# Patient Record
Sex: Female | Born: 1995 | Race: White | Hispanic: No | Marital: Single | Smoking: Never smoker
Health system: Southern US, Community
[De-identification: ages and names within clinical notes are randomized; demographics above are authoritative.]

## PROBLEM LIST (undated history)

## (undated) DIAGNOSIS — I059 Rheumatic mitral valve disease, unspecified: Secondary | ICD-10-CM

## (undated) DIAGNOSIS — G901 Familial dysautonomia [Riley-Day]: Secondary | ICD-10-CM

## (undated) DIAGNOSIS — R42 Dizziness and giddiness: Secondary | ICD-10-CM

## (undated) DIAGNOSIS — J45909 Unspecified asthma, uncomplicated: Secondary | ICD-10-CM

## (undated) HISTORY — DX: Dizziness and giddiness: R42

## (undated) HISTORY — DX: Unspecified asthma, uncomplicated: J45.909

## (undated) HISTORY — DX: Familial dysautonomia (riley-day): G90.1

## (undated) HISTORY — PX: TONSILLECTOMY AND ADENOIDECTOMY: SHX28

---
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
## 2017-06-13 ENCOUNTER — Encounter (INDEPENDENT_AMBULATORY_CARE_PROVIDER_SITE_OTHER): Payer: Self-pay

## 2017-06-13 ENCOUNTER — Ambulatory Visit (INDEPENDENT_AMBULATORY_CARE_PROVIDER_SITE_OTHER): Payer: BLUE CROSS/BLUE SHIELD | Admitting: Internal Medicine

## 2017-06-13 ENCOUNTER — Encounter: Payer: Self-pay | Admitting: Internal Medicine

## 2017-06-13 DIAGNOSIS — R002 Palpitations: Secondary | ICD-10-CM | POA: Diagnosis not present

## 2017-06-13 DIAGNOSIS — R42 Dizziness and giddiness: Secondary | ICD-10-CM | POA: Diagnosis not present

## 2017-06-13 DIAGNOSIS — I10 Essential (primary) hypertension: Secondary | ICD-10-CM | POA: Diagnosis not present

## 2017-06-13 MED ORDER — PINDOLOL 5 MG PO TABS: 5.0000 mg | ORAL_TABLET | Freq: Two times a day (BID) | ORAL | 3 refills | Status: DC

## 2017-06-13 MED ORDER — PINDOLOL 5 MG PO TABS: 2.5000 mg | ORAL_TABLET | Freq: Two times a day (BID) | ORAL | 3 refills | Status: DC

## 2017-06-13 NOTE — Patient Instructions (Addendum)
Your physician has recommended you make the following change in your medication:  1.) start pindolol 2.5 mg twice a day  Your physician recommends that you schedule a follow-up appointment in: 4-6 weeks with Dr. Tenny Crawoss.  We will plan on Wednesday July 11.

## 2017-06-13 NOTE — Progress Notes (Signed)
Cardiology Office Note   Date:  06/13/2017   ID:  Alexandra Malone, DOB [birth year], MRN [id number]  PCP:  System, Provider Not In  Cardiologist:   Dietrich PatesPaula Pedro [place]enburg, MD   Patient presents for evaluation of chest tightness    History of Present Illness: Alexandra Malone is a 21 y.o. female with a history of chest tightness  Started at age 21.  Thought she had astma   Treated with inhalers which did not help  High school had  Syncope  Two years ago had syncope while having bowel movement    Recently BP has been up  More dizziness and he has had chest pain   153/100 (sore throat)  on 5/25   126/91 (broken foot) on 4/12    Works and goes to school  [employer]    Lifts weights  Trys to stay away from aerobic becase HR buzzer goes off    Appetite  Bowels moving OK  Drinks water 3 L per day  Craves salt    Current Meds  Medication Sig  . TRI-LO-MARZIA 0.18/0.215/0.25 MG-25 MCG tab Take 1 tablet by mouth daily.      Allergies:   Latex; Shellfish allergy; and Shellfish-derived products   Past Medical History:  Diagnosis Date  . Asthma   . Dizziness   . Mitral valve disorder     History reviewed. No pertinent surgical history.   Social History:  The patient  reports that she has never smoked. She has never used smokeless tobacco. She reports that she does not drink alcohol or use drugs.   Family History:  The patient's family history is not on file.    ROS:  Please see the history of present illness. All other systems are reviewed and  Negative to the above problem except as noted.    PHYSICAL EXAM: VS:  BP 128/72   Ht 5' 3.5" (1.613 m)   Wt 54.9 kg (121 lb 1.9 oz)   BMI 21.12 kg/m   GEN: Well nourished, well developed, in no acute distress  HEENT: normal  Neck: no JVD, carotid bruits, or masses Cardiac: RRR; no murmurs, rubs, or gallops,no edema  Respiratory:  clear to auscultation bilaterally, normal work of breathing GI: soft, nontender, nondistended, + BS  No  hepatomegaly  MS: no deformity Moving all extremities   Skin: warm and dry, no rash Neuro:  Strength and sensation are intact Psych: euthymic mood, full affect   EKG:  EKG is ordered today.SR 77 bpm     Lipid Panel No results found for: CHOL, TRIG, HDL, CHOLHDL, VLDL, LDLCALC, LDLDIRECT    Wt Readings from Last 3 Encounters:  06/13/17 54.9 kg (121 lb 1.9 oz) (36 %, Z= -0.35)*  10/01/14 49.4 kg (109 lb) (22 %, Z= -0.77)*   * Growth percentiles are based on CDC 2-20 Years data.      ASSESSMENT AND PLAN:  Dizziness, hypertension  Pt's history is suspicious for autonomic dysfuntion even though she is not orthostatic     I would recomm a trial of low dose pintolol 2.5 bid   Follow up later this summer  Drink adequate lipids  Try to stay active aerobically  2  HTN  Pindolol as noted     Current medicines are reviewed at length with the patient today.  The patient does not have concerns regarding medicines.  Signed, Dietrich PatesPaula Frady Taddeo, MD  06/13/2017 3:04 PM    [facility] [address], [place], [place]  [address] Phone: [phone]; Fax: [phone]

## 2017-07-21 ENCOUNTER — Telehealth: Payer: Self-pay | Admitting: Internal Medicine

## 2017-07-21 NOTE — Telephone Encounter (Signed)
Pt called as she is having difficulty sleeping, having night terror, about 5 nights a week since starting this medication. Pt stated she has started taking some Melatonin at night that she thinks is helping but she is still having the palpitations and may need an increased dose of Pindolol.  Pt states she was told our office would call her to schedule f/u as many dates in July and August were discussed but she never received a call to finalize. Pt returns to college on 8/16, so scheduled to see Dr. Tenny Crawoss at [place] office on 8/3 at 8:20am. Pt states she is unsure if she has any refill left on medication as she takes last dose today. Educated pt that 3 refills were sent to her [facility] and to call our office if she has any issue getting refills. RN told pt message will be sent to Dr. Tenny Crawoss to make her aware. Pt verbalized understanding no additional questions at this time.

## 2017-07-21 NOTE — Telephone Encounter (Signed)
New Message     Pt c/o medication issue:  1. Name of Medication:   pindolol (VISKEN) 5 MG tablet Take 0.5 tablets (2.5 mg total) by mouth 2 (two) times daily.     2. How are you currently taking this medication (dosage and times per day)?  2 x a day   3. Are you having a reaction (difficulty breathing--STAT)? Not sleeping   4. What is your medication issue?  Not helping with bp and palpitations , she thinks she needs a higher dose

## 2017-07-25 ENCOUNTER — Ambulatory Visit (INDEPENDENT_AMBULATORY_CARE_PROVIDER_SITE_OTHER): Payer: BLUE CROSS/BLUE SHIELD | Admitting: Internal Medicine

## 2017-07-25 ENCOUNTER — Encounter: Payer: Self-pay | Admitting: Internal Medicine

## 2017-07-25 VITALS — BP 112/78 | HR 100 | Ht 63.0 in | Wt 124.0 lb

## 2017-07-25 DIAGNOSIS — R42 Dizziness and giddiness: Secondary | ICD-10-CM

## 2017-07-25 DIAGNOSIS — R002 Palpitations: Secondary | ICD-10-CM

## 2017-07-25 DIAGNOSIS — I10 Essential (primary) hypertension: Secondary | ICD-10-CM | POA: Diagnosis not present

## 2017-07-25 NOTE — Progress Notes (Signed)
Cardiology Office Note   Date:  07/26/2017   ID:  Alexandra RammingFallon Huckeby, DOB [birth year], MRN [id number]  PCP:  System, Provider Not In  Cardiologist:   Dietrich PatesPaula Oakley Orban, MD   Patient  presents for f/u of dizziness and  chest tightness    History of Present Illness: Patient is a 21 yo who presents for f/u   Pt with history of syncope (with bowel movement), dizziness and chst tightness   Hx of HTN   I saw her earlier this spring  REcomm she try pindolol  The pt says that on pindolol she had some very vivid dreams  May be improving  Stays active  Some dizziness  SOme HA (posteriror; taking magnesium) Denies syncope   Starts back at [employer] in a few wks  She says she feels better there on campus  [employer]  Drinking ample fluids      Current Meds  Medication Sig  . pindolol (VISKEN) 5 MG tablet Take 0.5 tablets (2.5 mg total) by mouth 2 (two) times daily.  . TRI-LO-MARZIA 0.18/0.215/0.25 MG-25 MCG tab Take 1 tablet by mouth daily.      Allergies:   Latex; Shellfish allergy; and Shellfish-derived products   Past Medical History:  Diagnosis Date  . Asthma   . Dizziness   . Mitral valve disorder     Past Surgical History:  Procedure Laterality Date  . ABDOMINAL WALL DEFECT REPAIR  1998  . TONSILLECTOMY AND ADENOIDECTOMY       Social History:  The patient  reports that she has never smoked. She has never used smokeless tobacco. She reports that she does not drink alcohol or use drugs.   Family History:  The patient's family history includes Anxiety disorder in her father; Hypertension in her paternal grandmother.    ROS:  Please see the history of present illness. All other systems are reviewed and  Negative to the above problem except as noted.    PHYSICAL EXAM: VS:  BP 112/78   Pulse 100   Ht 5\' 3"  (1.6 m)   Wt 124 lb (56.2 kg)   SpO2 97%   BMI 21.97 kg/m   GEN: Well nourished, well developed, in no acute distress  HEENT: normal  Neck: no JVD, carotid bruits, or  masses Cardiac: RRR; no murmurs, rubs, or gallops,no edema  Respiratory:  clear to auscultation bilaterally, normal work of breathing GI: soft, nontender, nondistended, + BS  No hepatomegaly  MS: no deformity Moving all extremities   Skin: warm and dry, no rash Neuro:  Strength and sensation are intact Psych: euthymic mood, full affect   EKG:  EKG is not ordered     Lipid Panel No results found for: CHOL, TRIG, HDL, CHOLHDL, VLDL, LDLCALC, LDLDIRECT    Wt Readings from Last 3 Encounters:  07/25/17 124 lb (56.2 kg) (42 %, Z= -0.21)*  06/13/17 121 lb 1.9 oz (54.9 kg) (36 %, Z= -0.35)*  10/01/14 109 lb (49.4 kg) (22 %, Z= -0.77)*   * Growth percentiles are based on CDC 2-20 Years data.      ASSESSMENT AND PLAN:  1  Dizziness  Overall I get impression that not as bad  HR does go up with standing   Stay hydrated  Stay active  Keep on same regimen  2  HTN  BP is better   I would deep on same regimen  Follow dreams  If worsens will consider antother option    Pt will call when  back from break   Signed, Dietrich PatesPaula Braelon Sprung, MD  07/26/2017 11:56 PM    [facility] [address], [place], [place]  [address] Phone: [phone]; Fax: [phone]

## 2017-07-25 NOTE — Patient Instructions (Addendum)
Your physician recommends that you schedule a follow-up appointment in: keep in contact via e-mail, plan to see Dr.Ross at winter break.    Your physician recommends that you continue on your current medications as directed. Please refer to the Current Medication list given to you today.      No testing or lab work ordered today.       Thank you for choosing [facility] !

## 2018-01-01 ENCOUNTER — Other Ambulatory Visit: Payer: Self-pay | Admitting: *Deleted

## 2018-01-02 MED ORDER — PINDOLOL 5 MG PO TABS: 2.5000 mg | ORAL_TABLET | Freq: Two times a day (BID) | ORAL | 3 refills | Status: DC

## 2018-06-15 ENCOUNTER — Other Ambulatory Visit: Payer: Self-pay | Admitting: Internal Medicine

## 2018-12-12 ENCOUNTER — Other Ambulatory Visit: Payer: Self-pay | Admitting: Internal Medicine

## 2019-07-27 ENCOUNTER — Telehealth: Payer: Self-pay

## 2019-07-27 NOTE — Telephone Encounter (Signed)
YOUR CARDIOLOGY TEAM HAS ARRANGED FOR AN E-VISIT FOR YOUR APPOINTMENT - PLEASE REVIEW IMPORTANT INFORMATION BELOW SEVERAL DAYS PRIOR TO YOUR APPOINTMENT  Due to the recent COVID-19 pandemic, we are transitioning in-person office visits to tele-medicine visits in an effort to decrease unnecessary exposure to our patients, their families, and staff. These visits are billed to your insurance just like a normal visit is. We also encourage you to sign up for MyChart if you have not already done so. You will need a smartphone if possible. For patients that do not have this, we can still complete the visit using a regular telephone but do prefer a smartphone to enable video when possible. You may have a family member that lives with you that can help. If possible, we also ask that you have a blood pressure cuff and scale at home to measure your blood pressure, heart rate and weight prior to your scheduled appointment. Patients with clinical needs that need an in-person evaluation and testing will still be able to come to the office if absolutely necessary. If you have any questions, feel free to call our office.     YOUR PROVIDER WILL BE USING THE FOLLOWING PLATFORM TO COMPLETE YOUR VISIT: [email or web address]  . IF USING MYCHART - How to Download the MyChart App to Your SmartPhone   - If Apple, go to App Store and type in MyChart in the search bar and download the app. If Android, ask patient to go to Google Play Store and type in MyChart in the search bar and download the app. The app is free but as with any other app downloads, your phone may require you to verify saved payment information or Apple/Android password.  - You will need to then log into the app with your MyChart username and password, and select [facility] as your healthcare provider to link the account.  - When it is time for your visit, go to the MyChart app, find appointments, and click Begin Video Visit. Be sure to Select Allow for your device to  access the Microphone and Camera for your visit. You will then be connected, and your provider will be with you shortly.  **If you have any issues connecting or need assistance, please contact MyChart service desk [phone] ([phone])**  **If using a computer, in order to ensure the best quality for your visit, you will need to use either of the following Internet Browsers: Google Chrome or Microsoft Edge**  . IF USING DOXIMITY or [email or web address].ME - The staff will give you instructions on receiving your link to join the meeting the day of your visit.      2-3 DAYS BEFORE YOUR APPOINTMENT  You will receive a telephone call from one of our [facility] team members - your caller ID may say "Unknown caller." If this is a video visit, we will walk you through how to get the video launched on your phone. We will remind you check your blood pressure, heart rate and weight prior to your scheduled appointment. If you have an Apple Watch or Kardia, please upload any pertinent ECG strips the day before or morning of your appointment to MyChart. Our staff will also make sure you have reviewed the consent and agree to move forward with your scheduled tele-health visit.     THE DAY OF YOUR APPOINTMENT  Approximately 15 minutes prior to your scheduled appointment, you will receive a telephone call from one of [facility] team - your caller ID may say "Unknown caller."    Our staff will confirm medications, vital signs for the day and any symptoms you may be experiencing. Please have this information available prior to the time of visit start. It may also be helpful for you to have a pad of paper and pen handy for any instructions given during your visit. They will also walk you through joining the smartphone meeting if this is a video visit.    CONSENT FOR TELE-HEALTH VISIT - PLEASE REVIEW  I hereby voluntarily request, consent and authorize [facility] and its employed or contracted physicians, physician  assistants, nurse practitioners or other licensed health care professionals (the Practitioner), to provide me with telemedicine health care services (the "Services") as deemed necessary by the treating Practitioner. I acknowledge and consent to receive the Services by the Practitioner via telemedicine. I understand that the telemedicine visit will involve communicating with the Practitioner through live audiovisual communication technology and the disclosure of certain medical information by electronic transmission. I acknowledge that I have been given the opportunity to request an in-person assessment or other available alternative prior to the telemedicine visit and am voluntarily participating in the telemedicine visit.  I understand that I have the right to withhold or withdraw my consent to the use of telemedicine in the course of my care at any time, without affecting my right to future care or treatment, and that the Practitioner or I may terminate the telemedicine visit at any time. I understand that I have the right to inspect all information obtained and/or recorded in the course of the telemedicine visit and may receive copies of available information for a reasonable fee.  I understand that some of the potential risks of receiving the Services via telemedicine include:  . Delay or interruption in medical evaluation due to technological equipment failure or disruption; . Information transmitted may not be sufficient (e.g. poor resolution of images) to allow for appropriate medical decision making by the Practitioner; and/or  . In rare instances, security protocols could fail, causing a breach of personal health information.  Furthermore, I acknowledge that it is my responsibility to provide information about my medical history, conditions and care that is complete and accurate to the best of my ability. I acknowledge that Practitioner's advice, recommendations, and/or decision may be based on  factors not within their control, such as incomplete or inaccurate data provided by me or distortions of diagnostic images or specimens that may result from electronic transmissions. I understand that the practice of medicine is not an exact science and that Practitioner makes no warranties or guarantees regarding treatment outcomes. I acknowledge that I will receive a copy of this consent concurrently upon execution via email to the email address I last provided but may also request a printed copy by calling the office of [facility].    I understand that my insurance will be billed for this visit.   I have read or had this consent read to me. . I understand the contents of this consent, which adequately explains the benefits and risks of the Services being provided via telemedicine.  . I have been provided ample opportunity to ask questions regarding this consent and the Services and have had my questions answered to my satisfaction. . I give my informed consent for the services to be provided through the use of telemedicine in my medical care  By participating in this telemedicine visit I agree to the above.  

## 2019-07-29 ENCOUNTER — Telehealth: Payer: BLUE CROSS/BLUE SHIELD | Admitting: Physician Assistant

## 2019-07-29 NOTE — Progress Notes (Signed)
Virtual Visit via Video Note   This visit type was conducted due to national recommendations for restrictions regarding the COVID-19 Pandemic (e.g. social distancing) in an effort to limit this patient's exposure and mitigate transmission in our community.  Due to her co-morbid illnesses, this patient is at least at moderate risk for complications without adequate follow up.  This format is felt to be most appropriate for this patient at this time.  All issues noted in this document were discussed and addressed.  A limited physical exam was performed with this format.  Please refer to the patient's chart for her consent to telehealth for [facility].   Date:  07/30/2019   ID:  Alexandra Malone, DOB Apr 11, 1996, MRN [id number]  Patient Location: Home Provider Location: Home  PCP:  System, Provider Not In  Cardiologist:  Dietrich PatesPaula Ross, MD  Electrophysiologist:  None   Evaluation Performed:  Follow-Up Visit  Chief Complaint:  FU on HTN, autonomic dysfunction  History of Present Illness:    Alexandra RammingFallon Malone is a 23 y.o. female with:  Hypertension  Autonomic dysfunction  Ms. Alexandra Malone was last seen by Dr. Tenny Crawoss in 07/2017.  Today, she is seen for follow-up.  She has been doing well.  She increased her pindolol to 5 mg twice daily.  Her blood pressures have been optimal.  She has not had a blood pressure cuff for a few months now.  However, her pressure was ranging in the 120s systolic.  She has not had chest pain, shortness of breath, syncope.  She has not had any significant dizziness.  She returns to complete her senior year at [employer] in the next week. Unfortunately, her classes will all be virtual.  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  The patient does not have symptoms concerning for COVID-19 infection (fever, chills, cough, or new shortness of breath).    Past Medical History:  Diagnosis Date  . Asthma   . Dizziness   . Mitral valve  disorder    Past Surgical History:  Procedure Laterality Date  . ABDOMINAL WALL DEFECT REPAIR  1998  . TONSILLECTOMY AND ADENOIDECTOMY       Current Meds  Medication Sig  . pindolol (VISKEN) 5 MG tablet Take 1 tablet (5 mg total) by mouth 2 (two) times daily. Please make overdue appt with Dr. Tenny Crawoss before anymore refills. 1st attempt  . sertraline (ZOLOFT) 25 MG tablet Take 25 mg by mouth daily.  . TRI-LO-MARZIA 0.18/0.215/0.25 MG-25 MCG tab Take 1 tablet by mouth daily.   . [DISCONTINUED] pindolol (VISKEN) 5 MG tablet Take 0.5 tablets (2.5 mg total) by mouth 2 (two) times daily. Please make overdue appt with Dr. Tenny Crawoss before anymore refills. 1st attempt     Allergies:   Latex, Shellfish allergy, and Shellfish-derived products   Social History   Tobacco Use  . Smoking status: Never Smoker  . Smokeless tobacco: Never Used  Substance Use Topics  . Alcohol use: No  . Drug use: No     Family Hx: The patient's family history includes Anxiety disorder in her father; Hypertension in her paternal grandmother.  ROS:   Please see the history of present illness.     All other systems reviewed and are negative.   Prior CV studies:   The following studies were reviewed today:  None  Labs/Other Tests and Data Reviewed:    EKG:  No ECG reviewed.  Recent Labs: No results found for requested labs  within last 8760 hours.   Recent Lipid Panel No results found for: CHOL, TRIG, HDL, CHOLHDL, LDLCALC, LDLDIRECT  Wt Readings from Last 3 Encounters:  07/30/19 125 lb (56.7 kg)  07/25/17 124 lb (56.2 kg) (42 %, Z= -0.21)*  06/13/17 121 lb 1.9 oz (54.9 kg) (36 %, Z= -0.35)*   * Growth percentiles are based on CDC (Girls, 2-20 Years) data.     Objective:    Vital Signs:  Ht 5\' 3"  (1.6 m)   Wt 125 lb (56.7 kg)   BMI 22.14 kg/m    VITAL SIGNS:  reviewed GEN:  no acute distress EYES:  EOMI RESPIRATORY:  Normal respiratory effort NEURO:  Alert and oriented PSYCH:  normal affect   ASSESSMENT & PLAN:    1. Essential hypertension The patient's blood pressure is controlled on her current regimen.  Continue current therapy.   2. Autonomic dysfunction Symptoms overall quiescent on current dose of beta-blocker.  Continue current therapy.   COVID-19 Education: The signs and symptoms of COVID-19 were discussed with the patient and how to seek care for testing (follow up with PCP or arrange E-visit).  The importance of social distancing was discussed today.  Time:   Today, I have spent 5 minutes with the patient with telehealth technology discussing the above problems.     Medication Adjustments/Labs and Tests Ordered: Current medicines are reviewed at length with the patient today.  Concerns regarding medicines are outlined above.   Tests Ordered: No orders of the defined types were placed in this encounter.   Medication Changes: Meds ordered this encounter  Medications  . pindolol (VISKEN) 5 MG tablet    Sig: Take 1 tablet (5 mg total) by mouth 2 (two) times daily. Please make overdue appt with Dr. Harrington Challenger before anymore refills. 1st attempt    Dispense:  180 tablet    Refill:  3    Please call our office to schedule an overdue appointment with Dr. Harrington Challenger before anymore refills. [phone]. Thank you 1st attempt    Follow Up:  Virtual Visit or In Person in 1 year(s)  Signed, Richardson Dopp, PA-C  07/30/2019 1:04 PM    [place]

## 2019-07-30 ENCOUNTER — Telehealth (INDEPENDENT_AMBULATORY_CARE_PROVIDER_SITE_OTHER): Payer: BLUE CROSS/BLUE SHIELD | Admitting: Physician Assistant

## 2019-07-30 ENCOUNTER — Other Ambulatory Visit: Payer: Self-pay

## 2019-07-30 VITALS — Ht 63.0 in | Wt 125.0 lb

## 2019-07-30 DIAGNOSIS — G909 Disorder of the autonomic nervous system, unspecified: Secondary | ICD-10-CM

## 2019-07-30 DIAGNOSIS — I10 Essential (primary) hypertension: Secondary | ICD-10-CM

## 2019-07-30 MED ORDER — PINDOLOL 5 MG PO TABS: 5.0000 mg | ORAL_TABLET | Freq: Two times a day (BID) | ORAL | 3 refills | Status: DC

## 2019-07-30 NOTE — Patient Instructions (Signed)
Medication Instructions:   Your physician recommends that you continue on your current medications as directed. Please refer to the Current Medication list given to you today. [facility] with 3 refills.  If you need a refill on your cardiac medications before your next appointment, please call your pharmacy.   Lab work:  None ordered today  If you have labs (blood work) drawn today and your tests are completely normal, you will receive your results only by: Marland Kitchen MyChart Message (if you have MyChart) OR . A paper copy in the mail If you have any lab test that is abnormal or we need to change your treatment, we will call you to review the results.  Testing/Procedures:  None ordered today  Follow-Up: At [facility], you and your health needs are our priority.  As part of our continuing mission to provide you with exceptional heart care, we have created designated Provider Care Teams.  These Care Teams include your primary Cardiologist (physician) and Advanced Practice Providers (APPs -  Physician Assistants and Nurse Practitioners) who all work together to provide you with the care you need, when you need it. You will need a follow up appointment in 12 months.  Please call our office 2 months in advance to schedule this appointment.  You may see Dorris Carnes, MD or one of the following Advanced Practice Providers on your designated Care Team:    [place], PA-C Melina Copa, PA-C . Ermalinda Barrios, PA-C

## 2019-08-11 ENCOUNTER — Telehealth: Payer: Self-pay | Admitting: Physician Assistant

## 2019-08-11 NOTE — Telephone Encounter (Signed)
Attempted to call the pt twice and phone picked up but could hear anyone on the other end will try back this afternoon.

## 2019-08-11 NOTE — Telephone Encounter (Signed)
LMTCB

## 2019-08-11 NOTE — Telephone Encounter (Signed)
New Message:   Pt says she have been exposed to [place]. She says in a few days she will have to be tested for it. Her question is, if her results comes back positive. Will her being quarantined be enough with her condition please?

## 2019-08-12 NOTE — Telephone Encounter (Signed)
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 She denies any symptoms and reports feeling normal. I advised pt if she is symptomatic she could give [place] a call back but in the meantime continue self quarantining.

## 2020-11-12 NOTE — Progress Notes (Signed)
Virtual Visit via Video Note   This visit type was conducted due to national recommendations for restrictions regarding the COVID-19 Pandemic (e.g. social distancing) in an effort to limit this patient's exposure and mitigate transmission in our community.  Due to her co-morbid illnesses, this patient is at least at moderate risk for complications without adequate follow up.  This format is felt to be most appropriate for this patient at this time.  All issues noted in this document were discussed and addressed.  A limited physical exam was performed with this format.  Please refer to the patient's chart for her consent to telehealth for [facility].    Date:  11/13/2020   ID:  Alexandra Malone, DOB Jul 29, 1996, MRN [id number] The patient was identified using 2 identifiers.  Patient Location: Home Provider Location: Home Office  PCP:  System, Provider Not In  Cardiologist:  Dietrich Pates, MD   Electrophysiologist:  None   Evaluation Performed:  Follow-Up Visit  Chief Complaint:  Follow-up (HTN, autonomic dysfunction)    Patient Profile: Alexandra Malone is a 24 y.o. female with:  Hypertension   Autonomic dysfunction     History of Present Illness:   Alexandra Malone was last seen via Telemedicine in 07/2019.  She is seen for f/u.  She graduated from [employer] in the spring.  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  She is currently finishing up some of her pre-requisites.  She has been doing well overall.  Her BPs have been optimal.  She only occasionally has orthostatic symptoms.  She has not had chest pain, shortness of breath, syncope.  She feels the pindolol continues to control her symptoms well.    Past Medical History:  Diagnosis Date  . Asthma   . Dizziness   . Mitral valve disorder    Past Surgical History:  Procedure Laterality Date  . ABDOMINAL WALL DEFECT REPAIR  1998  . TONSILLECTOMY AND ADENOIDECTOMY       Current Meds  Medication Sig    . pindolol (VISKEN) 5 MG tablet Take 1 tablet (5 mg total) by mouth 2 (two) times daily.  . sertraline (ZOLOFT) 25 MG tablet Take 25 mg by mouth daily.  . TRI-LO-MARZIA 0.18/0.215/0.25 MG-25 MCG tab Take 1 tablet by mouth daily.   . [DISCONTINUED] pindolol (VISKEN) 5 MG tablet Take 1 tablet (5 mg total) by mouth 2 (two) times daily. Please make overdue appt with Dr. Tenny Craw before anymore refills. 1st attempt     Allergies:   Latex, Shellfish allergy, and Shellfish-derived products   Social History   Tobacco Use  . Smoking status: Never Smoker  . Smokeless tobacco: Never Used  Vaping Use  . Vaping Use: Never used  Substance Use Topics  . Alcohol use: No  . Drug use: No     Family Hx: The patient's family history includes Anxiety disorder in her father; Hypertension in her paternal grandmother.  ROS:   Please see the history of present illness.      Labs/Other Tests and Data Reviewed:    EKG:  No ECG reviewed.  Recent Labs: No results found for requested labs within last 8760 hours.   Recent Lipid Panel No results found for: CHOL, TRIG, HDL, CHOLHDL, LDLCALC, LDLDIRECT  Wt Readings from Last 3 Encounters:  11/13/20 115 lb (52.2 kg)  07/30/19 125 lb (56.7 kg)  07/25/17 124 lb (56.2 kg) (42 %, Z= -0.21)*   * Growth percentiles are based on  CDC (Girls, 2-20 Years) data.     Risk Assessment/Calculations:      Objective:    Vital Signs:  BP 120/75   Ht 5\' 3"  (1.6 m)   Wt 115 lb (52.2 kg)   BMI 20.37 kg/m    VITAL SIGNS:  reviewed GEN:  no acute distress EYES:  sclerae anicteric, EOMI - Extraocular Movements Intact RESPIRATORY:  normal respiratory effort PSYCH:  normal affect  ASSESSMENT & PLAN:    1. Essential hypertension The patient's blood pressure is controlled on her current regimen.  Continue current therapy.    2. Autonomic dysfunction Overall, her symptoms are controlled on the current dose of Pindolol.  Continue current Rx.        Time:    Today, I have spent 3 minutes with the patient with telehealth technology discussing the above problems.     Medication Adjustments/Labs and Tests Ordered: Current medicines are reviewed at length with the patient today.  Concerns regarding medicines are outlined above.   Tests Ordered: No orders of the defined types were placed in this encounter.   Medication Changes: Meds ordered this encounter  Medications  . pindolol (VISKEN) 5 MG tablet    Sig: Take 1 tablet (5 mg total) by mouth 2 (two) times daily.    Dispense:  180 tablet    Refill:  3    Follow Up:  In Person in 1 year(s)  Signed, , PA-C  11/13/2020 3:01 PM    [facility]

## 2020-11-13 ENCOUNTER — Telehealth (INDEPENDENT_AMBULATORY_CARE_PROVIDER_SITE_OTHER): Payer: Self-pay | Admitting: Physician Assistant

## 2020-11-13 ENCOUNTER — Encounter: Payer: Self-pay | Admitting: Physician Assistant

## 2020-11-13 ENCOUNTER — Other Ambulatory Visit: Payer: Self-pay

## 2020-11-13 VITALS — BP 120/75 | Ht 63.0 in | Wt 115.0 lb

## 2020-11-13 DIAGNOSIS — I10 Essential (primary) hypertension: Secondary | ICD-10-CM

## 2020-11-13 DIAGNOSIS — G909 Disorder of the autonomic nervous system, unspecified: Secondary | ICD-10-CM

## 2020-11-13 MED ORDER — PINDOLOL 5 MG PO TABS: 5.0000 mg | ORAL_TABLET | Freq: Two times a day (BID) | ORAL | 3 refills | Status: AC

## 2020-11-13 NOTE — Patient Instructions (Signed)
Medication Instructions:  Your physician recommends that you continue on your current medications as directed. Please refer to the Current Medication list given to you today.  *If you need a refill on your cardiac medications before your next appointment, please call your pharmacy*  Lab Work: None ordered today  Testing/Procedures: None ordered today  Follow-Up: At [facility], you and your health needs are our priority.  As part of our continuing mission to provide you with exceptional heart care, we have created designated Provider Care Teams.  These Care Teams include your primary Cardiologist (physician) and Advanced Practice Providers (APPs -  Physician Assistants and Nurse Practitioners) who all work together to provide you with the care you need, when you need it.  Your next appointment:   12 month(s)  The format for your next appointment:   In Person  Provider:   Dietrich Pates, MD or Tereso Newcomer, PA-C

## 2021-03-26 ENCOUNTER — Telehealth: Payer: Self-pay | Admitting: Cardiology

## 2021-03-26 NOTE — Telephone Encounter (Signed)
Pt c/o medication issue:  1. Name of Medication:  pindolol (VISKEN) 5 MG tablet [[id number]]    2. How are you currently taking this medication (dosage and times per day)?Take 1 tablet (5 mg total) by mouth 2 (two) times daily.   3. Are you having a reaction (difficulty breathing--STAT)? Na   4. What is your medication issue? Pt stated she doesn't have health ins and this med $220 .  She stated she can not afford this med.  She would like to know what her options are for a cheaper med   Best number [phone]

## 2021-03-26 NOTE — Telephone Encounter (Signed)
Returned the call to the patient. She stated that she no longer has insurance so cannot afford the Pindolol. She would like to know what else she can take that won't cost as much.  She stated that a message can be sent to her through MyChart.

## 2021-03-27 NOTE — Telephone Encounter (Signed)
It looks like [facility] be ~ [facility] [place] if she uses the Good Rx card.   See if she wants to try that first before we try to change to another med. Tereso Newcomer, PA-C    03/27/2021 1:31 PM

## 2022-03-26 ENCOUNTER — Encounter: Payer: Self-pay | Admitting: Family Medicine

## 2022-03-26 ENCOUNTER — Ambulatory Visit (INDEPENDENT_AMBULATORY_CARE_PROVIDER_SITE_OTHER): Payer: Self-pay | Admitting: Family Medicine

## 2022-03-26 VITALS — BP 114/71 | HR 72 | Temp 98.1°F | Resp 20 | Ht 63.0 in | Wt 120.0 lb

## 2022-03-26 DIAGNOSIS — G909 Disorder of the autonomic nervous system, unspecified: Secondary | ICD-10-CM | POA: Insufficient documentation

## 2022-03-26 DIAGNOSIS — I341 Nonrheumatic mitral (valve) prolapse: Secondary | ICD-10-CM | POA: Insufficient documentation

## 2022-03-26 DIAGNOSIS — Z7689 Persons encountering health services in other specified circumstances: Secondary | ICD-10-CM

## 2022-03-26 NOTE — Progress Notes (Signed)
?  ? ?Subjective:  ?Patient ID: Alexandra Malone, female    DOB: 16-Dec-1996, 26 y.o.   MRN: [id number] ? ?Patient Care Team: ?Baruch Gouty, FNP as PCP - General (Family Medicine) ?Fay Records, MD as PCP - Cardiology (Cardiology)  ? ?Chief Complaint:  Establish Care ? ? ?HPI: ?Alexandra Malone is a 26 y.o. female presenting on 03/26/2022 for Establish Care ? ? ?Pt presents today to establish care with new PCP. She has been followed by [facility] at school and those records have been requested. She has autonomic dysfunction and MVP - followed by cardiology and symptoms are well controlled with Pindolol. She states she is doing well overall. She currently is self pay and does not wish to have labs today. She denies any specific complaints or concerns. States labs and PAP with health center have been normal. 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  ? ? ?  03/26/2022  ?  9:22 AM  ?GAD 7 : Generalized Anxiety Score  ?Nervous, Anxious, on Edge 2  ?Control/stop worrying 0  ?Worry too much - different things 1  ?Trouble relaxing 1  ?Restless 0  ?Easily annoyed or irritable 1  ?Afraid - awful might happen 1  ?Total GAD 7 Score 6  ?Anxiety Difficulty Somewhat difficult  ? ? ? ?  03/26/2022  ?  9:22 AM  ?Depression screen PHQ 2/9  ?Decreased Interest 0  ?Down, Depressed, Hopeless 0  ?PHQ - 2 Score 0  ?Altered sleeping 0  ?Tired, decreased energy 0  ?Change in appetite 0  ?Feeling bad or failure about yourself  0  ?Trouble concentrating 0  ?Moving slowly or fidgety/restless 0  ?Suicidal thoughts 0  ?PHQ-9 Score 0  ?Difficult doing work/chores Not difficult at all  ? ? ? ? ?Relevant past medical, surgical, family, and social history reviewed and updated as indicated.  ?Allergies and medications reviewed and updated. Data reviewed: Chart in Epic. ? ? ?Past Medical History:  ?Diagnosis Date  ? Asthma   ? Dizziness   ? Dysautonomia ([place])   ? Mitral valve disorder   ? ? ?Past Surgical History:   ?Procedure Laterality Date  ? ABDOMINAL WALL DEFECT REPAIR  1998  ? TONSILLECTOMY AND ADENOIDECTOMY    ? ? ?Social History  ? ?Socioeconomic History  ? Marital status: Single  ?  Spouse name: Not on file  ? Number of children: Not on file  ? Years of education: Not on file  ? Highest education level: Not on file  ?Occupational History  ? Not on file  ?Tobacco Use  ? Smoking status: Never  ? Smokeless tobacco: Never  ?Vaping Use  ? Vaping Use: Never used  ?Substance and Sexual Activity  ? Alcohol use: No  ? Drug use: No  ? Sexual activity: Yes  ?  Birth control/protection: Injection  ?Other Topics Concern  ? Not on file  ?Social History Narrative  ? Not on file  ? ?Social Determinants of Health  ? ?Financial Resource Strain: Not on file  ?Food Insecurity: Not on file  ?Transportation Needs: Not on file  ?Physical Activity: Not on file  ?Stress: Not on file  ?Social Connections: Not on file  ?Intimate Partner Violence: Not on file  ? ? ?Outpatient Encounter Medications as of 03/26/2022  ?Medication Sig  ? pindolol (VISKEN) 5 MG tablet Take 1 tablet (5 mg total) by mouth 2 (two) times daily.  ? TRI-LO-MARZIA 0.18/0.215/0.25 MG-25 MCG tab  Take 1 tablet by mouth daily.   ? [DISCONTINUED] sertraline (ZOLOFT) 25 MG tablet Take 25 mg by mouth daily.  ? ?No facility-administered encounter medications on file as of 03/26/2022.  ? ? ?Allergies  ?Allergen Reactions  ? Latex Anaphylaxis  ? Shellfish Allergy   ? Shellfish-Derived Products Other (See Comments)  ? ? ?Review of Systems  ?Constitutional:  Negative for activity change, appetite change, chills, diaphoresis, fatigue, fever and unexpected weight change.  ?HENT: Negative.    ?Eyes: Negative.  Negative for photophobia and visual disturbance.  ?Respiratory:  Negative for apnea, cough, choking, chest tightness, shortness of breath, wheezing and stridor.   ?Cardiovascular:  Negative for chest pain, palpitations and leg swelling.  ?Gastrointestinal:  Negative for abdominal pain,  blood in stool, constipation, diarrhea, nausea and vomiting.  ?Endocrine: Negative.   ?Genitourinary:  Negative for decreased urine volume, difficulty urinating, dysuria, frequency, menstrual problem and urgency.  ?Musculoskeletal:  Negative for arthralgias and myalgias.  ?Skin: Negative.   ?Allergic/Immunologic: Negative.   ?Neurological:  Negative for dizziness, tremors, seizures, syncope, facial asymmetry, speech difficulty, weakness, light-headedness, numbness and headaches.  ?Hematological: Negative.   ?Psychiatric/Behavioral:  Positive for agitation. Negative for behavioral problems, confusion, decreased concentration, dysphoric mood, hallucinations, self-injury, sleep disturbance and suicidal ideas. The patient is nervous/anxious. The patient is not hyperactive.   ?All other systems reviewed and are negative. ? ?   ? ?Objective:  ?BP 114/71   Pulse 72   Temp 98.1 ?F (36.7 ?C)   Resp 20   Ht 5\' 3"  (1.6 m)   Wt 120 lb (54.4 kg)   LMP 03/05/2022 (Approximate)   SpO2 97%   BMI 21.26 kg/m?   ? ?Wt Readings from Last 3 Encounters:  ?03/26/22 120 lb (54.4 kg)  ?11/13/20 115 lb (52.2 kg)  ?07/30/19 125 lb (56.7 kg)  ? ? ?Physical Exam ?Vitals and nursing note reviewed.  ?Constitutional:   ?   General: She is not in acute distress. ?   Appearance: Normal appearance. She is well-developed, well-groomed and normal weight. She is not ill-appearing, toxic-appearing or diaphoretic.  ?HENT:  ?   Head: Normocephalic and atraumatic.  ?   Jaw: There is normal jaw occlusion.  ?   Right Ear: Hearing normal.  ?   Left Ear: Hearing normal.  ?   Nose: Nose normal.  ?   Mouth/Throat:  ?   Lips: Pink.  ?   Mouth: Mucous membranes are moist.  ?   Pharynx: Oropharynx is clear. Uvula midline.  ?Eyes:  ?   General: Lids are normal.  ?   Extraocular Movements: Extraocular movements intact.  ?   Conjunctiva/sclera: Conjunctivae normal.  ?   Pupils: Pupils are equal, round, and reactive to light.  ?Neck:  ?   Thyroid: No thyroid  mass, thyromegaly or thyroid tenderness.  ?   Vascular: No carotid bruit or JVD.  ?   Trachea: Trachea and phonation normal.  ?Cardiovascular:  ?   Rate and Rhythm: Normal rate and regular rhythm.  ?   Chest Wall: PMI is not displaced.  ?   Pulses: Normal pulses.  ?   Heart sounds: Normal heart sounds. No murmur heard. ?  No friction rub. No gallop.  ?Pulmonary:  ?   Effort: Pulmonary effort is normal. No respiratory distress.  ?   Breath sounds: Normal breath sounds. No wheezing.  ?Abdominal:  ?   General: Bowel sounds are normal. There is no abdominal bruit.  ?   Palpations:  Abdomen is soft. There is no hepatomegaly or splenomegaly.  ?Musculoskeletal:     ?   General: Normal range of motion.  ?   Cervical back: Normal range of motion and neck supple.  ?   Right lower leg: No edema.  ?   Left lower leg: No edema.  ?Lymphadenopathy:  ?   Cervical: No cervical adenopathy.  ?Skin: ?   General: Skin is warm and dry.  ?   Capillary Refill: Capillary refill takes less than 2 seconds.  ?   Coloration: Skin is not cyanotic, jaundiced or pale.  ?   Findings: No rash.  ?Neurological:  ?   General: No focal deficit present.  ?   Mental Status: She is alert and oriented to person, place, and time.  ?   Sensory: Sensation is intact.  ?   Motor: Motor function is intact.  ?   Coordination: Coordination is intact.  ?   Gait: Gait is intact.  ?   Deep Tendon Reflexes: Reflexes are normal and symmetric.  ?Psychiatric:     ?   Attention and Perception: Attention and perception normal.     ?   Mood and Affect: Mood and affect normal.     ?   Speech: Speech normal.     ?   Behavior: Behavior normal. Behavior is cooperative.     ?   Thought Content: Thought content normal.     ?   Cognition and Memory: Cognition and memory normal.     ?   Judgment: Judgment normal.  ? ? ?Results for orders placed or performed during the hospital encounter of 10/01/14  ?CBC with Differential  ?Result Value Ref Range  ? WBC 10.5 4.5 - 13.5 K/uL  ? RBC  4.59 3.80 - 5.70 MIL/uL  ? Hemoglobin 13.9 12.0 - 16.0 g/dL  ? HCT 41.3 36.0 - 49.0 %  ? MCV 90.0 78.0 - 98.0 fL  ? MCH 30.3 25.0 - 34.0 pg  ? MCHC 33.7 31.0 - 37.0 g/dL  ? RDW 12.4 11.4 - 15.5 %  ? Platelets 216 150 - 40

## 2022-09-25 ENCOUNTER — Encounter: Payer: Self-pay | Admitting: Family Medicine
